# Patient Record
Sex: Female | Born: 1950 | Race: Black or African American | Hispanic: No | Marital: Married | Smoking: Never smoker
Health system: Southern US, Community
[De-identification: ages and names within clinical notes are randomized; demographics above are authoritative.]

## PROBLEM LIST (undated history)

## (undated) DIAGNOSIS — M1990 Unspecified osteoarthritis, unspecified site: Secondary | ICD-10-CM

## (undated) DIAGNOSIS — M069 Rheumatoid arthritis, unspecified: Secondary | ICD-10-CM

## (undated) DIAGNOSIS — T7840XA Allergy, unspecified, initial encounter: Secondary | ICD-10-CM

## (undated) DIAGNOSIS — D126 Benign neoplasm of colon, unspecified: Secondary | ICD-10-CM

## (undated) DIAGNOSIS — G4700 Insomnia, unspecified: Secondary | ICD-10-CM

## (undated) DIAGNOSIS — E785 Hyperlipidemia, unspecified: Secondary | ICD-10-CM

## (undated) DIAGNOSIS — K219 Gastro-esophageal reflux disease without esophagitis: Secondary | ICD-10-CM

## (undated) DIAGNOSIS — I10 Essential (primary) hypertension: Secondary | ICD-10-CM

## (undated) DIAGNOSIS — D849 Immunodeficiency, unspecified: Secondary | ICD-10-CM

## (undated) DIAGNOSIS — C801 Malignant (primary) neoplasm, unspecified: Secondary | ICD-10-CM

## (undated) DIAGNOSIS — Z5189 Encounter for other specified aftercare: Secondary | ICD-10-CM

## (undated) DIAGNOSIS — R748 Abnormal levels of other serum enzymes: Secondary | ICD-10-CM

## (undated) DIAGNOSIS — E876 Hypokalemia: Secondary | ICD-10-CM

## (undated) DIAGNOSIS — IMO0001 Reserved for inherently not codable concepts without codable children: Secondary | ICD-10-CM

## (undated) DIAGNOSIS — R911 Solitary pulmonary nodule: Secondary | ICD-10-CM

## (undated) DIAGNOSIS — G629 Polyneuropathy, unspecified: Secondary | ICD-10-CM

## (undated) HISTORY — PX: COLON RESECTION: SHX5231

## (undated) HISTORY — DX: Gastro-esophageal reflux disease without esophagitis: K21.9

## (undated) HISTORY — DX: Encounter for other specified aftercare: Z51.89

## (undated) HISTORY — PX: COLON SURGERY: SHX602

## (undated) HISTORY — DX: Malignant (primary) neoplasm, unspecified: C80.1

## (undated) HISTORY — DX: Reserved for inherently not codable concepts without codable children: IMO0001

## (undated) HISTORY — PX: APPENDECTOMY: SHX54

## (undated) HISTORY — DX: Solitary pulmonary nodule: R91.1

## (undated) HISTORY — DX: Polyneuropathy, unspecified: G62.9

## (undated) HISTORY — DX: Rheumatoid arthritis, unspecified: M06.9

## (undated) HISTORY — DX: Immunodeficiency, unspecified: D84.9

## (undated) HISTORY — PX: POLYPECTOMY: SHX149

## (undated) HISTORY — DX: Benign neoplasm of colon, unspecified: D12.6

## (undated) HISTORY — DX: Essential (primary) hypertension: I10

## (undated) HISTORY — DX: Hyperlipidemia, unspecified: E78.5

## (undated) HISTORY — DX: Abnormal levels of other serum enzymes: R74.8

## (undated) HISTORY — DX: Insomnia, unspecified: G47.00

## (undated) HISTORY — PX: COLONOSCOPY: SHX174

## (undated) HISTORY — DX: Hypokalemia: E87.6

## (undated) HISTORY — DX: Allergy, unspecified, initial encounter: T78.40XA

---

## 2003-03-15 DIAGNOSIS — C20 Malignant neoplasm of rectum: Secondary | ICD-10-CM

## 2003-03-15 HISTORY — DX: Malignant neoplasm of rectum: C20

## 2010-07-02 ENCOUNTER — Emergency Department (HOSPITAL_COMMUNITY)
Admission: EM | Admit: 2010-07-02 | Discharge: 2010-07-02 | Disposition: A | Discharge status: Home / Self Care | Payer: BC Managed Care – PPO | Attending: Emergency Medicine | Admitting: Emergency Medicine

## 2010-07-02 DIAGNOSIS — I10 Essential (primary) hypertension: Secondary | ICD-10-CM | POA: Insufficient documentation

## 2010-07-02 DIAGNOSIS — Z85038 Personal history of other malignant neoplasm of large intestine: Secondary | ICD-10-CM | POA: Insufficient documentation

## 2010-07-02 DIAGNOSIS — K5900 Constipation, unspecified: Secondary | ICD-10-CM | POA: Insufficient documentation

## 2010-07-02 DIAGNOSIS — K625 Hemorrhage of anus and rectum: Secondary | ICD-10-CM | POA: Insufficient documentation

## 2010-07-02 DIAGNOSIS — K649 Unspecified hemorrhoids: Secondary | ICD-10-CM | POA: Insufficient documentation

## 2010-07-02 LAB — COMPREHENSIVE METABOLIC PANEL
AST: 31 U/L (ref 0–37)
Albumin: 3.9 g/dL (ref 3.5–5.2)
Calcium: 9.8 mg/dL (ref 8.4–10.5)
Creatinine, Ser: 0.63 mg/dL (ref 0.4–1.2)
GFR calc Af Amer: >60 mL/min (ref >60)

## 2010-07-02 LAB — DIFFERENTIAL
Basophils Relative: 0 % (ref 0–1)
Eosinophils Absolute: 0 10*3/uL (ref 0.0–0.7)
Eosinophils Relative: 1 % (ref 0–5)
Monocytes Absolute: 0.3 10*3/uL (ref 0.1–1.0)
Monocytes Relative: 7 % (ref 3–12)
Neutrophils Relative %: 67 % (ref 43–77)

## 2010-07-02 LAB — CBC
MCH: 26.5 pg (ref 26.0–34.0)
MCHC: 33.6 g/dL (ref 30.0–36.0)
Platelets: 158 10*3/uL (ref 150–400)
RDW: 14.9 % (ref 11.5–15.5)

## 2010-07-16 ENCOUNTER — Ambulatory Visit (AMBULATORY_SURGERY_CENTER): Payer: BC Managed Care – PPO | Admitting: *Deleted

## 2010-07-16 VITALS — Ht 64.0 in | Wt 138.0 lb

## 2010-07-16 DIAGNOSIS — K921 Melena: Secondary | ICD-10-CM

## 2010-07-16 DIAGNOSIS — Z85038 Personal history of other malignant neoplasm of large intestine: Secondary | ICD-10-CM

## 2010-07-16 MED ORDER — PEG-KCL-NACL-NASULF-NA ASC-C 100 G PO SOLR: ORAL | Status: DC

## 2010-07-19 ENCOUNTER — Encounter: Payer: Self-pay | Admitting: Gastroenterology

## 2010-07-19 ENCOUNTER — Telehealth: Payer: Self-pay | Admitting: Gastroenterology

## 2010-07-19 NOTE — Telephone Encounter (Signed)
Forwarded to Dr. Patterson for review.  °

## 2010-07-30 ENCOUNTER — Ambulatory Visit (AMBULATORY_SURGERY_CENTER): Payer: BC Managed Care – PPO | Admitting: Gastroenterology

## 2010-07-30 ENCOUNTER — Encounter: Payer: Self-pay | Admitting: Gastroenterology

## 2010-07-30 VITALS — BP 155/106 | HR 89 | Temp 98.2°F | Resp 18 | Ht 64.0 in | Wt 134.0 lb

## 2010-07-30 DIAGNOSIS — Z85038 Personal history of other malignant neoplasm of large intestine: Secondary | ICD-10-CM

## 2010-07-30 MED ORDER — SODIUM CHLORIDE 0.9 % IV SOLN: 500.0000 mL | INTRAVENOUS | Status: AC

## 2010-07-30 MED ORDER — POLYETHYLENE GLYCOL 3350 17 GM/SCOOP PO POWD: 17.0000 g | Freq: Every day | ORAL | Status: AC

## 2010-07-30 MED ORDER — LIDOCAINE (ANORECTAL) 5 % EX CREA: 1.0000 "application " | TOPICAL_CREAM | Freq: Two times a day (BID) | CUTANEOUS | Status: DC

## 2010-07-30 NOTE — Patient Instructions (Signed)
Please refer to blue and green discharge instruction sheets. 

## 2010-08-02 ENCOUNTER — Telehealth: Payer: Self-pay

## 2010-08-02 NOTE — Telephone Encounter (Signed)
Line busy

## 2014-05-08 ENCOUNTER — Encounter: Payer: Self-pay | Admitting: Gastroenterology

## 2015-03-23 ENCOUNTER — Encounter: Payer: Self-pay | Admitting: Internal Medicine

## 2015-05-04 ENCOUNTER — Ambulatory Visit (AMBULATORY_SURGERY_CENTER): Payer: Self-pay

## 2015-05-04 VITALS — Ht 64.0 in | Wt 147.0 lb

## 2015-05-04 DIAGNOSIS — Z800 Family history of malignant neoplasm of digestive organs: Secondary | ICD-10-CM

## 2015-05-04 MED ORDER — NA SULFATE-K SULFATE-MG SULF 17.5-3.13-1.6 GM/177ML PO SOLN: 1.0000 | Freq: Once | ORAL | Status: DC

## 2015-05-04 NOTE — Progress Notes (Signed)
No egg or soy allergies Not on home 02 No previous anesthesia complications No diet or weight loss meds 

## 2015-05-20 ENCOUNTER — Encounter: Payer: Self-pay | Admitting: Internal Medicine

## 2015-05-25 ENCOUNTER — Encounter: Payer: Self-pay | Admitting: Internal Medicine

## 2015-05-25 ENCOUNTER — Ambulatory Visit (AMBULATORY_SURGERY_CENTER): Payer: BLUE CROSS/BLUE SHIELD | Admitting: Internal Medicine

## 2015-05-25 VITALS — BP 138/81 | HR 67 | Temp 99.8°F | Resp 15 | Ht 64.0 in | Wt 147.0 lb

## 2015-05-25 DIAGNOSIS — D125 Benign neoplasm of sigmoid colon: Secondary | ICD-10-CM | POA: Diagnosis not present

## 2015-05-25 DIAGNOSIS — Z1211 Encounter for screening for malignant neoplasm of colon: Secondary | ICD-10-CM | POA: Diagnosis not present

## 2015-05-25 DIAGNOSIS — K635 Polyp of colon: Secondary | ICD-10-CM

## 2015-05-25 DIAGNOSIS — D122 Benign neoplasm of ascending colon: Secondary | ICD-10-CM | POA: Diagnosis not present

## 2015-05-25 DIAGNOSIS — Z85038 Personal history of other malignant neoplasm of large intestine: Secondary | ICD-10-CM | POA: Diagnosis not present

## 2015-05-25 MED ORDER — SODIUM CHLORIDE 0.9 % IV SOLN: 500.0000 mL | INTRAVENOUS | Status: DC

## 2015-05-25 NOTE — Progress Notes (Signed)
A/ox3, pleased with MAC, report to RN 

## 2015-05-25 NOTE — Op Note (Signed)
[place] Patient Name: Alexis Cabrera Procedure Date: 05/25/2015 1:42 PM MRN: [id number] Endoscopist: Jerene Bears , MD Age: 65 Referring MD:  Date of Birth: 1951/01/21 Gender: Female Procedure:                Colonoscopy Indications:              High risk colon cancer surveillance: Personal                            history of colon cancer, Last colonoscopy: 2012 Medicines:                Monitored Anesthesia Care Procedure:                Pre-Anesthesia Assessment:                           - Prior to the procedure, a History and Physical                            was performed, and patient medications and                            allergies were reviewed. The patient's tolerance of                            previous anesthesia was also reviewed. The risks                            and benefits of the procedure and the sedation                            options and risks were discussed with the patient.                            All questions were answered, and informed consent                            was obtained. Prior Anticoagulants: The patient has                            taken no previous anticoagulant or antiplatelet                            agents. ASA Grade Assessment: III - A patient with                            severe systemic disease. After reviewing the risks                            and benefits, the patient was deemed in                            satisfactory condition to undergo the procedure.  After obtaining informed consent, the colonoscope                            was passed under direct vision. Throughout the                            procedure, the patient's blood pressure, pulse, and                            oxygen saturations were monitored continuously. The                            Model PCF-H190L [id number]) scope was introduced                            through the anus and advanced to the  the cecum,                            identified by appendiceal orifice and ileocecal                            valve. The colonoscopy was performed without                            difficulty. The patient tolerated the procedure                            well. The quality of the bowel preparation was                            good. The quality of the bowel preparation was                            good. The ileocecal valve, appendiceal orifice, and                            rectum were photographed. Scope In: 1:58:52 PM Scope Out: 2:14:08 PM Scope Withdrawal Time: 0 hours 12 minutes 1 second  Total Procedure Duration: 0 hours 15 minutes 16 seconds  Findings:      The perianal and digital rectal examinations were normal.      A 4 mm polyp was found in the ascending colon. The polyp was sessile.       The polyp was removed with a cold snare. Resection and retrieval were       complete.      Two sessile polyps were found in the sigmoid colon. The polyps were 2 to       3 mm in size. These polyps were removed with a cold snare. Resection and       retrieval were complete.      There was evidence of a prior end-to-end colo-colonic anastomosis in the       proximal rectum. This was patent and was characterized by an intact       staple line. The anastomosis was traversed and without significant       narrowing.  Retroflexion in the rectum was not performed due to post-surgical       anatomy but close inspection on forward view revealed hypertrophied anal       papillae and small internal hemorrhoids. Complications:            No immediate complications. Estimated Blood Loss:     Estimated blood loss was minimal. Impression:               - One 4 mm polyp in the ascending colon, removed                            with a cold snare. Resected and retrieved.                           - Two 2 to 3 mm polyps in the sigmoid colon,                            removed with a cold snare.  Resected and retrieved.                           - Patent end-to-end colo-colonic anastomosis,                            characterized by an intact staple line. Recommendation:           - Resume previous diet.                           - Continue present medications.                           - Await pathology results.                           - Repeat colonoscopy for surveillance based on                            pathology results. Procedure Code(s):        --- Professional ---                           [id number], Colonoscopy, flexible; with removal of                            tumor(s), polyp(s), or other lesion(s) by snare                            technique CPT copyright 2016 American Medical Association. All rights reserved. Lajuan Lines. Hilarie Fredrickson, MD Jerene Bears, MD 05/25/2015 2:22:21 PM This report has been signed electronically. Number of Addenda: 0

## 2015-05-25 NOTE — Progress Notes (Signed)
Called to room to assist during endoscopic procedure.  Patient ID and intended procedure confirmed with present staff. Received instructions for my participation in the procedure from the performing physician.  

## 2015-05-25 NOTE — Patient Instructions (Addendum)
YOU HAD AN ENDOSCOPIC PROCEDURE TODAY AT [facility]:   Refer to the procedure report that was given to you for any specific questions about what was found during the examination.  If the procedure report does not answer your questions, please call your gastroenterologist to clarify.  If you requested that your care partner not be given the details of your procedure findings, then the procedure report has been included in a sealed envelope for you to review at your convenience later.  YOU SHOULD EXPECT: Some feelings of bloating in the abdomen. Passage of more gas than usual.  Walking can help get rid of the air that was put into your GI tract during the procedure and reduce the bloating. If you had a lower endoscopy (such as a colonoscopy or flexible sigmoidoscopy) you may notice spotting of blood in your stool or on the toilet paper. If you underwent a bowel prep for your procedure, you may not have a normal bowel movement for a few days.  Please Note:  You might notice some irritation and congestion in your nose or some drainage.  This is from the oxygen used during your procedure.  There is no need for concern and it should clear up in a day or so.  SYMPTOMS TO REPORT IMMEDIATELY:   Following lower endoscopy (colonoscopy or flexible sigmoidoscopy):  Excessive amounts of blood in the stool  Significant tenderness or worsening of abdominal pains  Swelling of the abdomen that is new, acute  Fever of 100F or higher   For urgent or emergent issues, a gastroenterologist can be reached at any hour by calling [phone].   DIET: Your first meal following the procedure should be a small meal and then it is ok to progress to your normal diet. Heavy or fried foods are harder to digest and may make you feel nauseous or bloated.  Likewise, meals heavy in dairy and vegetables can increase bloating.  Drink plenty of fluids but you should avoid alcoholic beverages for 24  hours.  ACTIVITY:  You should plan to take it easy for the rest of today and you should NOT DRIVE or use heavy machinery until tomorrow (because of the sedation medicines used during the test).    FOLLOW UP: Our staff will call the number listed on your records the next business day following your procedure to check on you and address any questions or concerns that you may have regarding the information given to you following your procedure. If we do not reach you, we will leave a message.  However, if you are feeling well and you are not experiencing any problems, there is no need to return our call.  We will assume that you have returned to your regular daily activities without incident.  If any biopsies were taken you will be contacted by phone or by letter within the next 1-3 weeks.  Please call us at [phone] if you have not heard about the biopsies in 3 weeks.    SIGNATURES/CONFIDENTIALITY: You and/or your care partner have signed paperwork which will be entered into your electronic medical record.  These signatures attest to the fact that that the information above on your After Visit Summary has been reviewed and is understood.  Full responsibility of the confidentiality of this discharge information lies with you and/or your care-partner.  Polyps, hemorrhoids-handouts given  Repeat colonoscopy will be determined by pathology.   Miralax daily, if this does not work then call the office.

## 2015-05-26 ENCOUNTER — Telehealth: Payer: Self-pay | Admitting: *Deleted

## 2015-05-26 NOTE — Telephone Encounter (Signed)
  Follow up Call-  Call back number 05/25/2015  Post procedure Call Back phone  # [phone]  Permission to leave phone message Yes     Patient questions:  Do you have a fever, pain , or abdominal swelling? No. Pain Score  0 *  Have you tolerated food without any problems? Yes.    Have you been able to return to your normal activities? Yes.    Do you have any questions about your discharge instructions: Diet   No. Medications  No. Follow up visit  No.  Do you have questions or concerns about your Care? No.  Actions: * If pain score is 4 or above: No action needed, pain <4.

## 2015-05-28 ENCOUNTER — Encounter: Payer: Self-pay | Admitting: Internal Medicine

## 2016-05-23 ENCOUNTER — Encounter (HOSPITAL_COMMUNITY): Payer: Self-pay | Admitting: *Deleted

## 2016-05-23 ENCOUNTER — Emergency Department (HOSPITAL_COMMUNITY)
Admission: EM | Admit: 2016-05-23 | Discharge: 2016-05-24 | Disposition: A | Discharge status: Home / Self Care | Payer: BLUE CROSS/BLUE SHIELD | Attending: Emergency Medicine | Admitting: Emergency Medicine

## 2016-05-23 ENCOUNTER — Emergency Department (HOSPITAL_COMMUNITY): Payer: BLUE CROSS/BLUE SHIELD

## 2016-05-23 DIAGNOSIS — I10 Essential (primary) hypertension: Secondary | ICD-10-CM | POA: Insufficient documentation

## 2016-05-23 DIAGNOSIS — N3091 Cystitis, unspecified with hematuria: Secondary | ICD-10-CM | POA: Insufficient documentation

## 2016-05-23 DIAGNOSIS — Z79899 Other long term (current) drug therapy: Secondary | ICD-10-CM | POA: Insufficient documentation

## 2016-05-23 DIAGNOSIS — R319 Hematuria, unspecified: Secondary | ICD-10-CM | POA: Diagnosis present

## 2016-05-23 DIAGNOSIS — Z85038 Personal history of other malignant neoplasm of large intestine: Secondary | ICD-10-CM | POA: Insufficient documentation

## 2016-05-23 LAB — CBC WITH DIFFERENTIAL/PLATELET
BASOS PCT: 1 %
Basophils Absolute: 0 10*3/uL (ref 0.0–0.1)
EOS ABS: 0 10*3/uL (ref 0.0–0.7)
Eosinophils Relative: 0 %
HCT: 36.4 % (ref 36.0–46.0)
Hemoglobin: 12 g/dL (ref 12.0–15.0)
Lymphocytes Relative: 52 %
Lymphs Abs: 2 10*3/uL (ref 0.7–4.0)
MCH: 25.8 pg — AB (ref 26.0–34.0)
MCHC: 33 g/dL (ref 30.0–36.0)
MCV: 78.3 fL (ref 78.0–100.0)
MONO ABS: 0.3 10*3/uL (ref 0.1–1.0)
Monocytes Relative: 8 %
NEUTROS ABS: 1.5 10*3/uL — AB (ref 1.7–7.7)
Neutrophils Relative %: 39 %
PLATELETS: 158 10*3/uL (ref 150–400)
RBC: 4.65 MIL/uL (ref 3.87–5.11)
RDW: 14.8 % (ref 11.5–15.5)
WBC: 3.8 10*3/uL — ABNORMAL LOW (ref 4.0–10.5)

## 2016-05-23 LAB — URINALYSIS, ROUTINE W REFLEX MICROSCOPIC
BILIRUBIN URINE: NEGATIVE
Glucose, UA: NEGATIVE mg/dL
Ketones, ur: NEGATIVE mg/dL
LEUKOCYTES UA: NEGATIVE
NITRITE: NEGATIVE
PH: 6 (ref 5.0–8.0)
Protein, ur: 100 mg/dL — AB
SPECIFIC GRAVITY, URINE: 1.02 (ref 1.005–1.030)
Squamous Epithelial / LPF: NONE SEEN

## 2016-05-23 LAB — BASIC METABOLIC PANEL
Anion gap: 7 (ref 5–15)
BUN: 11 mg/dL (ref 6–20)
CHLORIDE: 110 mmol/L (ref 101–111)
CO2: 24 mmol/L (ref 22–32)
CREATININE: 0.73 mg/dL (ref 0.44–1.00)
Calcium: 8.6 mg/dL — ABNORMAL LOW (ref 8.9–10.3)
GFR calc Af Amer: >60 mL/min (ref >60)
GFR calc non Af Amer: >60 mL/min (ref >60)
Glucose, Bld: 100 mg/dL — ABNORMAL HIGH (ref 65–99)
Potassium: 3.3 mmol/L — ABNORMAL LOW (ref 3.5–5.1)
SODIUM: 141 mmol/L (ref 135–145)

## 2016-05-23 MED ORDER — NITROFURANTOIN MONOHYD MACRO 100 MG PO CAPS: 100.0000 mg | ORAL_CAPSULE | Freq: Two times a day (BID) | ORAL | 0 refills | Status: AC

## 2016-05-23 NOTE — ED Notes (Signed)
Patient transported to CT 

## 2016-05-23 NOTE — ED Triage Notes (Signed)
Sl nausea

## 2016-05-23 NOTE — ED Notes (Signed)
Nurse drawing labs. 

## 2016-05-23 NOTE — ED Triage Notes (Signed)
The pt is c/o bloody urine for 3 days  And she feels like she is not emptying her bladder

## 2016-05-23 NOTE — ED Notes (Signed)
Pt providing urine sample.

## 2016-05-23 NOTE — ED Provider Notes (Signed)
[facility] DEPT Provider Note   CSN: [id number] Arrival date & time: 05/23/16  2149     History   Chief Complaint Chief Complaint  Patient presents with  . Hematuria    HPI Alexis Cabrera is a 66 y.o. female.  HPI  66 year old female with distant history of colon cancer proximally 10 years ago presenting for evaluation of flank pain and hematuria. Patient reports 2 days of intermittent sharp pain radiating from her left flank to her groin. Pain is intermittent, severe. Reports that in between sharp pains, she does have a constant dull ache. Reports that her hematuria started during the day today. States that she is sure that it is not vaginal bleeding. Denies constipation, diarrhea, or blood in her stool. No nausea or vomiting. Denies fevers. No prior history of kidney stones. No aggravating or relieving factors or associated symptoms. Denies dysuria or frequency.   Past Medical History:  Diagnosis Date  . Allergy    seasonal  . Blood transfusion   . Blood transfusion without reported diagnosis   . 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 Hyperlipidemia   . Hypertension     There are no active problems to display for this patient.   Past Surgical History:  Procedure Laterality Date  . APPENDECTOMY     cancer  . CESAREAN SECTION     twice  . COLON RESECTION    . COLON SURGERY     cancer/treated with chemo and radiation  . COLONOSCOPY    . POLYPECTOMY      OB History    No data available       Home Medications    Prior to Admission medications   Medication Sig Start Date End Date Taking? Authorizing Provider  amLODipine (NORVASC) 5 MG tablet Take 5 mg by mouth daily.   Yes Historical Provider, MD  losartan (COZAAR) 50 MG tablet Take 50 mg by mouth daily.   Yes Historical Provider, MD  potassium chloride SA (K-DUR,KLOR-CON) 20 MEQ tablet Take 1 tablet by mouth Daily. 06/23/10  Yes Historical Provider, MD  zolpidem (AMBIEN) 10 MG tablet Take 10 mg by mouth at  bedtime as needed for sleep.   Yes Historical Provider, MD  nitrofurantoin, macrocrystal-monohydrate, (MACROBID) 100 MG capsule Take 1 capsule (100 mg total) by mouth 2 (two) times daily. 05/23/16 05/30/16  Jayme Mednick Algernon Huxley, MD    Family History Family History  Problem Relation Age of Onset  . Colon cancer Father 30  . Colon cancer Sister 95    Social History Social History  Substance Use Topics  . Smoking status: Never Smoker  . Smokeless tobacco: Never Used  . Alcohol use No     Allergies   Hydrocodone   Review of Systems Review of Systems  Constitutional: Negative for appetite change, chills and fever.  HENT: Negative for congestion, rhinorrhea and sore throat.   Eyes: Negative for visual disturbance.  Respiratory: Negative for cough, shortness of breath and wheezing.   Cardiovascular: Negative for chest pain and palpitations.  Gastrointestinal: Positive for abdominal pain. Negative for abdominal distention, blood in stool, constipation, diarrhea, nausea and vomiting.  Genitourinary: Positive for flank pain and hematuria. Negative for decreased urine volume, dysuria, frequency, pelvic pain, vaginal bleeding and vaginal discharge.  Musculoskeletal: Negative for arthralgias, back pain, gait problem, joint swelling, myalgias, neck pain and neck stiffness.  Skin: Negative for rash.  Neurological: Negative for dizziness, tremors, syncope, facial asymmetry, speech difficulty, weakness, numbness and headaches.  Psychiatric/Behavioral:  Negative for agitation, behavioral problems and confusion.     Physical Exam Updated Vital Signs BP 158/92   Pulse 66   Temp 98.2 F (36.8 C) (Oral)   Resp 22   Ht 5\' 4"  (1.626 m)   Wt 70.3 kg   SpO2 98%   BMI 26.61 kg/m   Physical Exam  Constitutional: She is oriented to person, place, and time. She appears well-developed and well-nourished. No distress.  HENT:  Head: Normocephalic and atraumatic.  Right Ear: External ear normal.    Left Ear: External ear normal.  Nose: Nose normal.  Mouth/Throat: Oropharynx is clear and moist. No oropharyngeal exudate.  Eyes: Conjunctivae and EOM are normal. Pupils are equal, round, and reactive to light. Right eye exhibits no discharge. Left eye exhibits no discharge.  Neck: Normal range of motion. Neck supple.  Cardiovascular: Normal rate, regular rhythm and normal heart sounds.  Exam reveals no gallop and no friction rub.   No murmur heard. Pulmonary/Chest: Breath sounds normal. No respiratory distress. She has no wheezes. She has no rales.  Abdominal: Soft. Bowel sounds are normal. She exhibits no distension. There is tenderness (L flank). There is no guarding.  Musculoskeletal: Normal range of motion. She exhibits no edema or tenderness.  Neurological: She is alert and oriented to person, place, and time. She exhibits normal muscle tone.  Skin: Skin is warm and dry. No rash noted. She is not diaphoretic.  Psychiatric: She has a normal mood and affect. Her behavior is normal. Judgment and thought content normal.     ED Treatments / Results  Labs (all labs ordered are listed, but only abnormal results are displayed) Labs Reviewed  URINALYSIS, ROUTINE W REFLEX MICROSCOPIC - Abnormal; Notable for the following:       Result Value   Color, Urine RED (*)    APPearance CLOUDY (*)    Hgb urine dipstick LARGE (*)    Protein, ur 100 (*)    Bacteria, UA FEW (*)    All other components within normal limits  CBC WITH DIFFERENTIAL/PLATELET - Abnormal; Notable for the following:    WBC 3.8 (*)    MCH 25.8 (*)    Neutro Abs 1.5 (*)    All other components within normal limits  BASIC METABOLIC PANEL - Abnormal; Notable for the following:    Potassium 3.3 (*)    Glucose, Bld 100 (*)    Calcium 8.6 (*)    All other components within normal limits    EKG  EKG Interpretation None       Radiology Ct Abdomen Pelvis Wo Contrast  Result Date: 05/23/2016 CLINICAL DATA:  Abdominal  pain with nausea for 3 days. EXAM: CT ABDOMEN AND PELVIS WITHOUT CONTRAST TECHNIQUE: Multidetector CT imaging of the abdomen and pelvis was performed following the standard protocol without IV contrast. COMPARISON:  None. FINDINGS: Lower chest: 3 mm nodule in the right lung base. 4 mm nodule in the left lung base. Small esophageal hiatal hernia. Hepatobiliary: No focal liver abnormality is seen. No gallstones, gallbladder wall thickening, or biliary dilatation. Pancreas: Unremarkable. No pancreatic ductal dilatation or surrounding inflammatory changes. Spleen: Normal in size without focal abnormality. Adrenals/Urinary Tract: No adrenal gland nodules. Kidneys are symmetrical. No hydronephrosis or hydroureter. No urinary tract stones. Bladder wall is not thickened. Stomach/Bowel: Postoperative changes with apparent rectal resection and sigmoid colorectal anastomosis. Surgical clips in the area. Stranding in the presacral fat without mass or nodularity. This probably represents postoperative scarring but comparison with any  old postoperative studies would be useful to evaluate for any change. Diffusely stool-filled colon without dilatation or wall thickening. Stomach and small bowel are decompressed. No evidence of obstruction but unable to evaluate further without oral contrast material. Vascular/Lymphatic: Aortic atherosclerosis. No enlarged abdominal or pelvic lymph nodes. Reproductive: Uterus and bilateral adnexa are unremarkable. Other: No free air or free fluid in the abdomen. Mild infiltration of the mesenteric knee with mesenteric lymph nodes. No pathologic lymphadenopathy. Changes likely represent inflammatory process, possibly mesenteritis. Musculoskeletal: Degenerative changes in the spine. No destructive bone lesions. IMPRESSION: No renal or ureteral stone or obstruction. Postoperative changes in the low pelvis suggesting rectal resection and anastomosis. Infiltration in the presacral space is probably  postoperative but correlation with any old outside films would be useful. Mild mesenteric infiltration may indicate mesenteritis. Three and 4 mm nodules in the lung bases. Non-contrast chest CT can be considered in 12 months in a patient with history of colon cancer. This recommendation follows the consensus statement: Guidelines for Management of Incidental Pulmonary Nodules Detected on CT Images: From the Fleischner Society 2017; Radiology 2017; 284:228-243. Electronically Signed   By: Lucienne Capers M.D.   On: 05/23/2016 23:11    Procedures Procedures (including critical care time)  Medications Ordered in ED Medications - No data to display   Initial Impression / Assessment and Plan / ED Course  I have reviewed the triage vital signs and the nursing notes.  Pertinent labs & imaging results that were available during my care of the patient were reviewed by me and considered in my medical decision making (see chart for details).     Generally well-appearing. Afebrile and hemodynamically stable. Abdominal exam benign, with the exception of mild tenderness to palpation over the left flank.  UA with large blood and few bacteria. Labs with no leukocytosis and normal hemoglobin. Patient has mild hypokalemia with potassium of 3.3, but is on daily potassium supplementation.   Differential diagnosis includes hemorrhagic cystitis vs kidney stone vs bladder malignancy.  CT abdomen and pelvis obtained that shows no renal or ureteral stone or obstruction. Post operative changes from the patient's partial colectomy seen. There are changes that may suggest mesenteritis, but no enlarged abdominal or pelvic lymph nodes. Patient also seen to have some nodules in the lung bases.  Results discussed with the patient and her husband at bedside. Discussed the need for follow-up CT chest without contrast in one year to evaluate for change in size of her lung nodules. This was explicitly written in the patient's  discharge instructions for her to share with her primary care provider. Patient does have small bacteria in her urine, so will treat with one-week course of Macrobid to see if this improves her hematuria. If hematuria does not improve with antibiotics, patient will require outpatient follow-up with urology for cystoscopy to rule out other pathologies such as malignancy. This was explained to the patient and her husband, and they were given the phone number for urology.  Return precautions given for fevers, worsening pain, increased bleeding, vomiting, or new or concerning symptoms. Patient discharged in good condition.  Care of patient overseen by my attending, Dr. Stark Jock.  Final Clinical Impressions(s) / ED Diagnoses   Final diagnoses:  Hemorrhagic cystitis    New Prescriptions New Prescriptions   NITROFURANTOIN, MACROCRYSTAL-MONOHYDRATE, (MACROBID) 100 MG CAPSULE    Take 1 capsule (100 mg total) by mouth 2 (two) times daily.     Donel Osowski Algernon Huxley, MD 05/24/16 Nolberto Hanlon    Nathaneil Canary  [place], MD 05/24/16 1546

## 2016-05-23 NOTE — Discharge Instructions (Signed)
The CT scan of your abdomen today showed some nodules within the lower regions of your lungs. Please have your doctor order a CT scan without contrast of your lungs in 1 year for follow-up to ensure that these have not changes in size.   You can try over the counter mucinex for symptomatic relief of your congestion.

## 2016-05-23 NOTE — ED Notes (Signed)
ED Provider at bedside. 

## 2016-05-24 NOTE — ED Notes (Signed)
Pt departed in NAD, refused use of a wheelchair.  

## 2017-05-30 ENCOUNTER — Encounter (HOSPITAL_COMMUNITY): Payer: Self-pay | Admitting: *Deleted

## 2017-05-30 ENCOUNTER — Emergency Department (HOSPITAL_COMMUNITY): Payer: BLUE CROSS/BLUE SHIELD

## 2017-05-30 ENCOUNTER — Emergency Department (HOSPITAL_COMMUNITY)
Admission: EM | Admit: 2017-05-30 | Discharge: 2017-05-30 | Disposition: A | Discharge status: Home / Self Care | Payer: BLUE CROSS/BLUE SHIELD | Attending: Emergency Medicine | Admitting: Emergency Medicine

## 2017-05-30 ENCOUNTER — Other Ambulatory Visit: Payer: Self-pay

## 2017-05-30 DIAGNOSIS — R0602 Shortness of breath: Secondary | ICD-10-CM | POA: Insufficient documentation

## 2017-05-30 DIAGNOSIS — R1084 Generalized abdominal pain: Secondary | ICD-10-CM | POA: Diagnosis present

## 2017-05-30 DIAGNOSIS — Z79899 Other long term (current) drug therapy: Secondary | ICD-10-CM | POA: Insufficient documentation

## 2017-05-30 DIAGNOSIS — R310 Gross hematuria: Secondary | ICD-10-CM | POA: Diagnosis not present

## 2017-05-30 DIAGNOSIS — E785 Hyperlipidemia, unspecified: Secondary | ICD-10-CM | POA: Insufficient documentation

## 2017-05-30 DIAGNOSIS — I10 Essential (primary) hypertension: Secondary | ICD-10-CM | POA: Diagnosis not present

## 2017-05-30 HISTORY — DX: Unspecified osteoarthritis, unspecified site: M19.90

## 2017-05-30 LAB — BASIC METABOLIC PANEL
Anion gap: 8 (ref 5–15)
BUN: 8 mg/dL (ref 6–20)
CHLORIDE: 107 mmol/L (ref 101–111)
CO2: 25 mmol/L (ref 22–32)
CREATININE: 0.68 mg/dL (ref 0.44–1.00)
Calcium: 9.2 mg/dL (ref 8.9–10.3)
GFR calc Af Amer: >60 mL/min (ref >60)
Glucose, Bld: 97 mg/dL (ref 65–99)
Potassium: 3.6 mmol/L (ref 3.5–5.1)
SODIUM: 140 mmol/L (ref 135–145)

## 2017-05-30 LAB — URINALYSIS, ROUTINE W REFLEX MICROSCOPIC
BILIRUBIN URINE: NEGATIVE
Bacteria, UA: NONE SEEN
GLUCOSE, UA: NEGATIVE mg/dL
Ketones, ur: NEGATIVE mg/dL
Leukocytes, UA: NEGATIVE
Nitrite: NEGATIVE
PH: 7 (ref 5.0–8.0)
Protein, ur: 30 mg/dL — AB
SPECIFIC GRAVITY, URINE: 1.012 (ref 1.005–1.030)
Squamous Epithelial / LPF: NONE SEEN

## 2017-05-30 LAB — CBC
HCT: 37.3 % (ref 36.0–46.0)
Hemoglobin: 12.2 g/dL (ref 12.0–15.0)
MCH: 26.1 pg (ref 26.0–34.0)
MCHC: 32.7 g/dL (ref 30.0–36.0)
MCV: 79.9 fL (ref 78.0–100.0)
PLATELETS: 223 10*3/uL (ref 150–400)
RBC: 4.67 MIL/uL (ref 3.87–5.11)
RDW: 15.6 % — AB (ref 11.5–15.5)
WBC: 4.9 10*3/uL (ref 4.0–10.5)

## 2017-05-30 LAB — I-STAT TROPONIN, ED: TROPONIN I, POC: 0 ng/mL (ref 0.00–0.08)

## 2017-05-30 MED ORDER — ALBUTEROL SULFATE (2.5 MG/3ML) 0.083% IN NEBU
5.0000 mg | INHALATION_SOLUTION | Freq: Once | RESPIRATORY_TRACT | Status: AC
  Administered 2017-05-30: 5 mg via RESPIRATORY_TRACT
  Filled 2017-05-30: qty 6

## 2017-05-30 MED ORDER — SODIUM CHLORIDE 0.9 % IV BOLUS (SEPSIS)
1000.0000 mL | Freq: Once | INTRAVENOUS | Status: AC
  Administered 2017-05-30: 1000 mL via INTRAVENOUS

## 2017-05-30 MED ORDER — ALBUTEROL SULFATE HFA 108 (90 BASE) MCG/ACT IN AERS
2.0000 | INHALATION_SPRAY | Freq: Once | RESPIRATORY_TRACT | Status: AC
  Administered 2017-05-30: 2 via RESPIRATORY_TRACT
  Filled 2017-05-30: qty 6.7

## 2017-05-30 NOTE — ED Provider Notes (Signed)
[facility] EMERGENCY DEPARTMENT Provider Note   CSN: [id number] Arrival date & time: 05/30/17  5093     History   Chief Complaint Chief Complaint  Patient presents with  . Flank Pain  . Hematuria  . Chest Pain    HPI Alexis Cabrera is a 67 y.o. female.  HPI Patient presents with concern of right flank pain, and ongoing chest pain. Right flank pain is new, starting 3 days ago, without clear precipitant. Since onset pain is been persistent, sharp, crampy, severe, in the right upper quadrant, right flank. Minimal relief with Excedrin. There is associated polyuria and hematuria, with some difficulty initiating urination. No inability to urinate, no fever, chills.  Patient also complains of ongoing chest discomfort. She describes this as tightness across the entire anterior thorax, with cough, dyspnea, but again no fever, no chills per Patient is a non-smoker. She has a notable history of rheumatoid arthritis, on Humira. In addition, the patient has a history of colon cancer with with metastases in the distant past. Patient is here with her husband who assists with the HPI.  Past Medical History:  Diagnosis Date  . Allergy    seasonal  . Arthritis   . Blood transfusion   . Blood transfusion without reported diagnosis   . Cancer [facility])    colon/in appendix  . Hyperlipidemia   . Hypertension     There are no active problems to display for this patient.   Past Surgical History:  Procedure Laterality Date  . APPENDECTOMY     cancer  . CESAREAN SECTION     twice  . COLON RESECTION    . COLON SURGERY     cancer/treated with chemo and radiation  . COLONOSCOPY    . POLYPECTOMY      OB History    No data available       Home Medications    Prior to Admission medications   Medication Sig Start Date End Date Taking? Authorizing Provider  amLODipine (NORVASC) 5 MG tablet Take 5 mg by mouth daily.    [provider]  losartan (COZAAR)  50 MG tablet Take 50 mg by mouth daily.    [provider]  potassium chloride SA (K-DUR,KLOR-CON) 20 MEQ tablet Take 1 tablet by mouth Daily. 06/23/10   [provider]  zolpidem (AMBIEN) 10 MG tablet Take 10 mg by mouth at bedtime as needed for sleep.    [provider]    Family History Family History  Problem Relation Age of Onset  . Colon cancer Father 92  . Colon cancer Sister 3    Social History Social History   Tobacco Use  . Smoking status: Never Smoker  . Smokeless tobacco: Never Used  Substance Use Topics  . Alcohol use: No    Alcohol/week: 0.0 oz  . Drug use: No     Allergies   Hydrocodone   Review of Systems Review of Systems  Constitutional:       Per HPI, otherwise negative  HENT:       Per HPI, otherwise negative  Respiratory:       Per HPI, otherwise negative  Cardiovascular:       Per HPI, otherwise negative  Gastrointestinal: Negative for vomiting.  Endocrine:       Negative aside from HPI  Genitourinary:       Neg aside from HPI   Musculoskeletal:       Per HPI, otherwise negative  Skin: Negative.  Neurological: Negative for syncope.     Physical Exam Updated Vital Signs BP (!) 184/94   Pulse 65   Temp 98.1 F (36.7 C) (Oral)   Resp 11   Ht 5\' 4"  (1.626 m)   Wt 69.9 kg (154 lb)   SpO2 97%   BMI 26.43 kg/m   Physical Exam  Constitutional: She is oriented to person, place, and time. She appears well-developed and well-nourished. No distress.  HENT:  Head: Normocephalic and atraumatic.  Eyes: Conjunctivae and EOM are normal.  Cardiovascular: Normal rate and regular rhythm.  Pulmonary/Chest: Effort normal and breath sounds normal. No stridor. No respiratory distress.  Abdominal: She exhibits no distension.  Mild tenderness palpation throughout the right upper quadrant, right flank, no guarding, rebound, left abdominal exam unremarkable.  Musculoskeletal: She exhibits no edema.  Neurological: She is  alert and oriented to person, place, and time. No cranial nerve deficit.  Skin: Skin is warm and dry.  Psychiatric: She has a normal mood and affect.  Nursing note and vitals reviewed.    ED Treatments / Results  Labs (all labs ordered are listed, but only abnormal results are displayed) Labs Reviewed  CBC - Abnormal; Notable for the following components:      Result Value   RDW 15.6 (*)    All other components within normal limits  URINALYSIS, ROUTINE W REFLEX MICROSCOPIC - Abnormal; Notable for the following components:   APPearance CLOUDY (*)    Hgb urine dipstick LARGE (*)    Protein, ur 30 (*)    All other components within normal limits  BASIC METABOLIC PANEL  I-STAT TROPONIN, ED    EKG  EKG Interpretation  Date/Time:  Tuesday May 30 2017 09:31:41 EDT Ventricular Rate:  77 PR Interval:  210 QRS Duration: 84 QT Interval:  404 QTC Calculation: 457 R Axis:   54 Text Interpretation:  Sinus rhythm with 1st degree A-V block Otherwise within normal limits Confirmed by Carmin Muskrat [phone]) on 05/30/2017 12:04:25 PM       Radiology Dg Chest 2 View  Result Date: 05/30/2017 CLINICAL DATA:  Chest pain, shortness of breath, and nonproductive cough for the past week. Patient also reports right flank pain with hematuria for the past 3 days. EXAM: CHEST - 2 VIEW COMPARISON:  None in PACs FINDINGS: The lungs are adequately inflated. The interstitial markings are mildly prominent. There is no alveolar infiltrate or pleural effusion. The heart and pulmonary vascularity are normal. There is calcification in the wall of the aortic arch and tortuosity of the descending thoracic aorta. The bony thorax exhibits no acute abnormality. IMPRESSION: Mild bilateral subsegmental atelectasis consistent with acute bronchitis. No alveolar pneumonia nor pulmonary edema. Thoracic aortic atherosclerosis. Electronically Signed   By: David  Martinique M.D.   On: 05/30/2017 09:58   Ct Renal Stone  Study  Result Date: 05/30/2017 CLINICAL DATA:  Flank pain, right flank pain, hematuria EXAM: CT ABDOMEN AND PELVIS WITHOUT CONTRAST TECHNIQUE: Multidetector CT imaging of the abdomen and pelvis was performed following the standard protocol without IV contrast. COMPARISON:  CT abdomen pelvis 05/23/2016 FINDINGS: Lower chest: Mild scarring in the lung bases. No infiltrate or effusion. Decreased visibility of small right lower lobe nodule since the prior study. Hepatobiliary: Normal liver, gallbladder, and bile ducts. Pancreas: Negative Spleen: Negative Adrenals/Urinary Tract: Negative for urinary tract calculi. No renal mass or obstruction. Normal bladder. Stomach/Bowel: Negative for bowel obstruction. Small hiatal hernia. Negative for bowel mass or edema. Postop appendectomy Vascular/Lymphatic: Mild atherosclerotic disease.  Negative for aortic aneurysm. No adenopathy. Reproductive: Normal uterus.  Negative for pelvic mass. Other: No free fluid. Surgical clips around the rectum. History of cancer of the colon. Musculoskeletal: No acute skeletal abnormality. Negative for fracture or mass. IMPRESSION: Negative for urinary tract calculi. No acute abnormality identified. Electronically Signed   By: Franchot Gallo M.D.   On: 05/30/2017 13:10    Procedures Procedures (including critical care time)  Medications Ordered in ED Medications  albuterol (PROVENTIL HFA;VENTOLIN HFA) 108 (90 Base) MCG/ACT inhaler 2 puff (not administered)  sodium chloride 0.9 % bolus 1,000 mL (1,000 mLs Intravenous New Bag/Given 05/30/17 1245)  albuterol (PROVENTIL) (2.5 MG/3ML) 0.083% nebulizer solution 5 mg (5 mg Nebulization Given 05/30/17 1416)     Initial Impression / Assessment and Plan / ED Course  I have reviewed the triage vital signs and the nursing notes.  Pertinent labs & imaging results that were available during my care of the patient were reviewed by me and considered in my medical decision making (see chart for  details).   On repeat exam the patient is in similar condition, initial findings reviewed with her and her husband. No CT evidence for kidney stone, though she has multiple areas of calcification, surgical clips, there is some suspicion for either occult stone or recently passed kidney stone. No change in her abdominal pain, and she defers offer of pain medication. With possibility for bronchitis, the patient will receive albuterol  2:54 PM Patient appears better, states that she feels better. We discussed the findings again, including generally reassuring CT scan, x-ray consistent with possible bronchitis, reassuring labs, vitals. There is ongoing hematuria, but no obvious stone, and no evidence for obstruction, and no urinalysis evidence for infection. Patient will follow up with urology for consideration of other evaluation in regards to this. Patient respiratory complaints have improved here after albuterol, and she will be discharged with scheduled albuterol therapy for the coming days.  When she is not a candidate for steroids given her use of Humira. No evidence for pneumonia, no evidence for bacteremia, sepsis, no ongoing chest pain, and given the chronicity of her symptoms, low suspicion for atypical ACS, no hypoxia, tachypnea consistent with PE.   Final Clinical Impressions(s) / ED Diagnoses   Final diagnoses:  Shortness of breath  Gross hematuria     Carmin Muskrat, MD 05/30/17 1457

## 2017-05-30 NOTE — Discharge Instructions (Signed)
As discussed, your evaluation today has been largely reassuring.  But, it is important that you monitor your condition carefully, and do not hesitate to return to the ED if you develop new, or concerning changes in your condition. ? ?Otherwise, please follow-up with your physician for appropriate ongoing care. ? ?

## 2017-05-30 NOTE — ED Triage Notes (Signed)
Pt reports right side pain for several days and having blood in urine. Has urinary pressure but denies any pain with urination or fever. Per family, pt having chest pains and sob x 1 week. No acute distress is noted at triage.

## 2019-06-04 IMAGING — CT CT RENAL STONE PROTOCOL
2 of 4 series · 16 of 46 positions shown, 18 images · non-contrast
Comparison: CT abdomen pelvis 05/23/2016

CLINICAL DATA: Flank pain, right flank pain, hematuria

EXAM:
CT ABDOMEN AND PELVIS WITHOUT CONTRAST
TECHNIQUE: Multidetector CT imaging of the abdomen and pelvis was performed
following the standard protocol without IV contrast.

[Series 3: renal stone 5.0 · axial · 0.84mm/px · z∈[+814,+1239]mm · 13 of 93 slices shown, 15 images]
[im 4/93  soft-tissue]
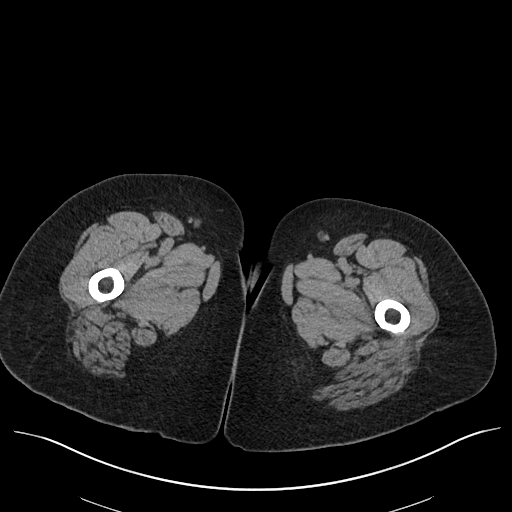
[im 4/93  bone]
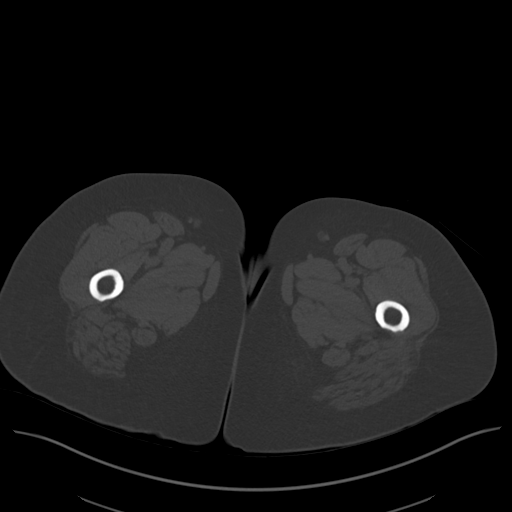
[im 12/93  soft-tissue]
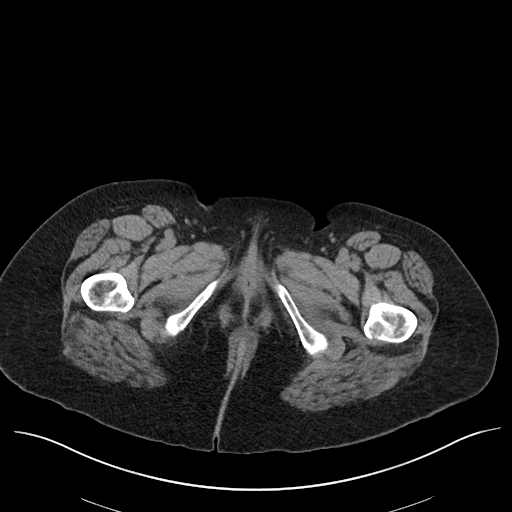
[im 19/93  soft-tissue]
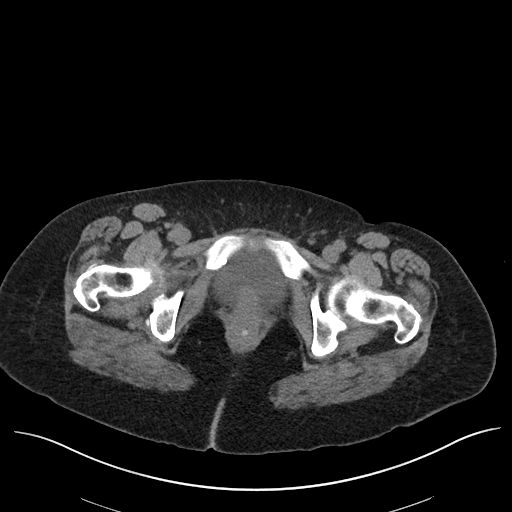
[im 26/93  soft-tissue]
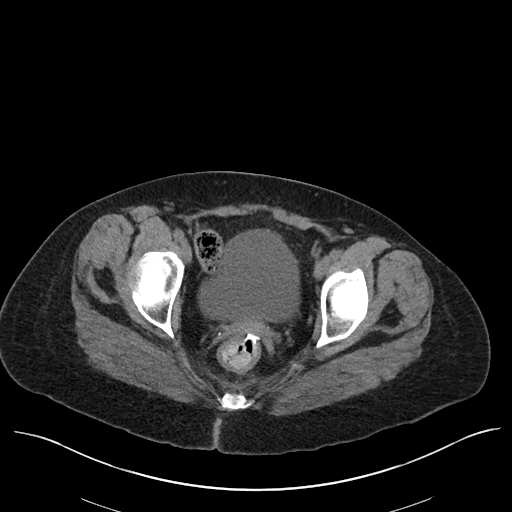
[im 34/93  soft-tissue]
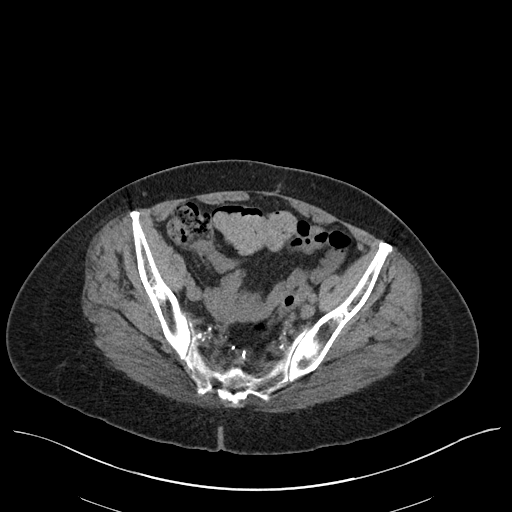
[im 41/93  soft-tissue]
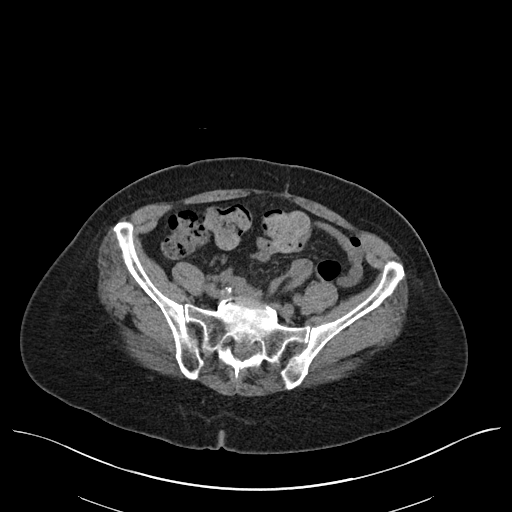
[im 48/93  soft-tissue]
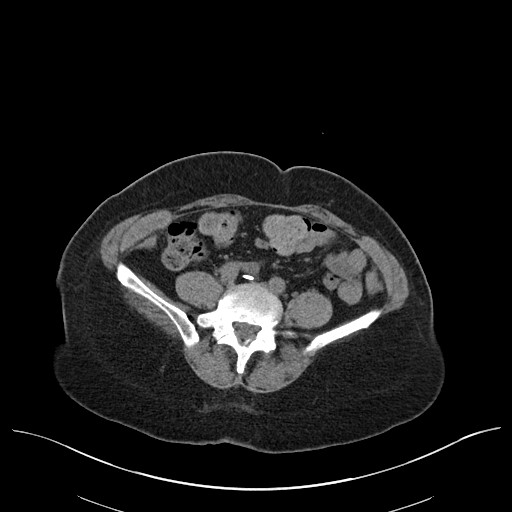
[im 52/93  soft-tissue]
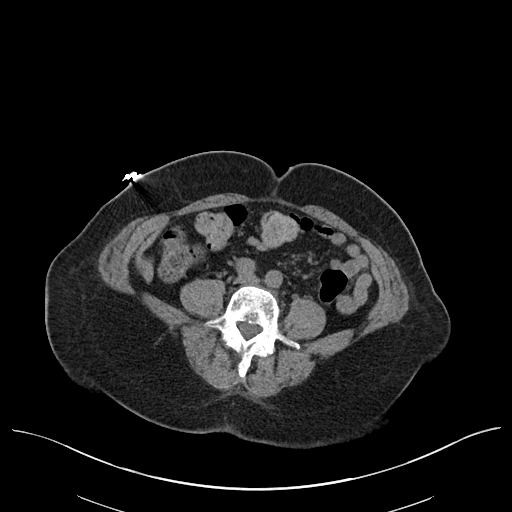
[im 59/93  soft-tissue]
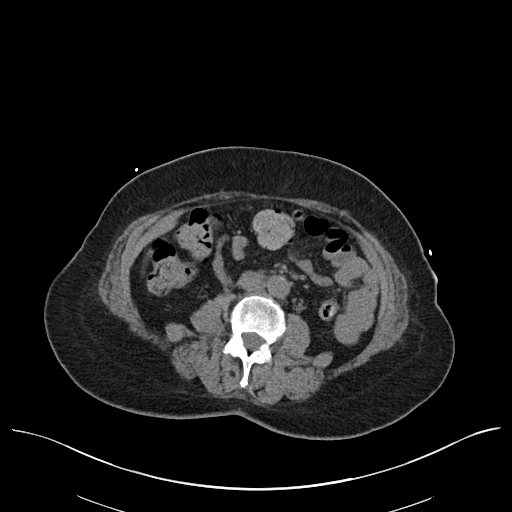
[im 59/93  bone]
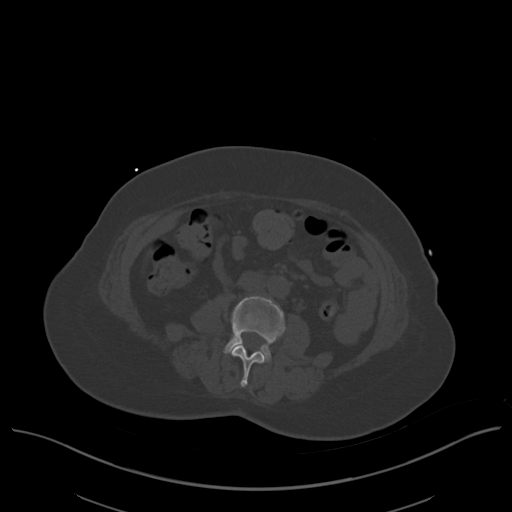
[im 67/93  soft-tissue]
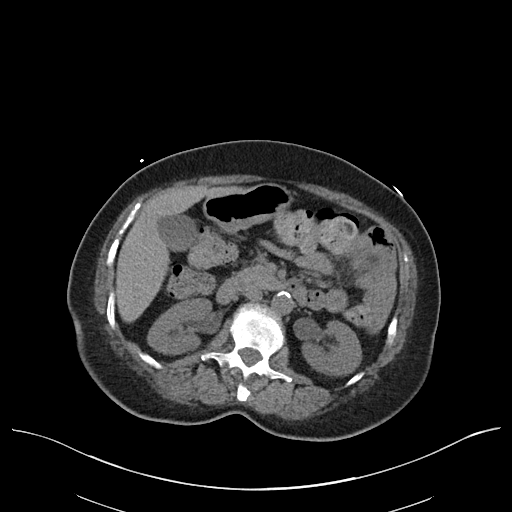
[im 74/93  soft-tissue]
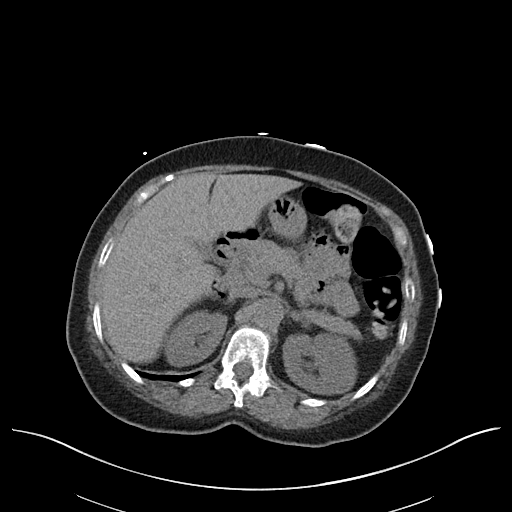
[im 81/93  soft-tissue]
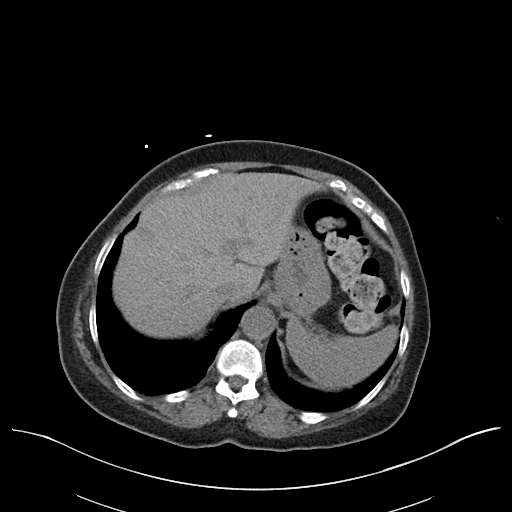
[im 89/93  soft-tissue]
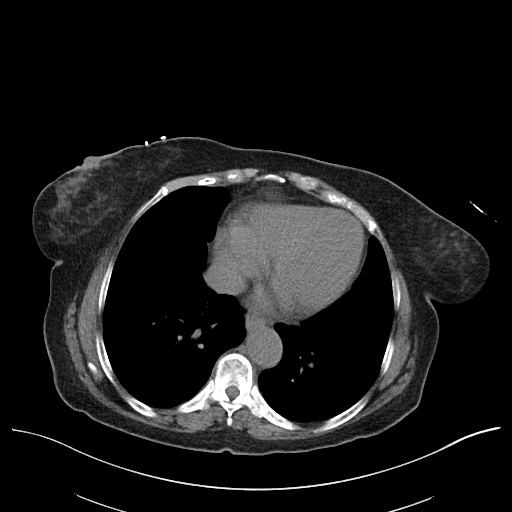

[Series 5: renal stone 3.0 cor · coronal · 0.73mm/px · 3 of 89 slices shown]
[im 30/89  soft-tissue]
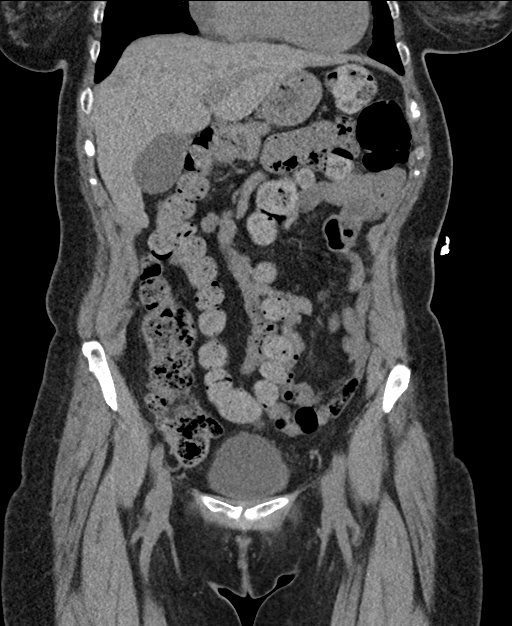
[im 40/89  soft-tissue]
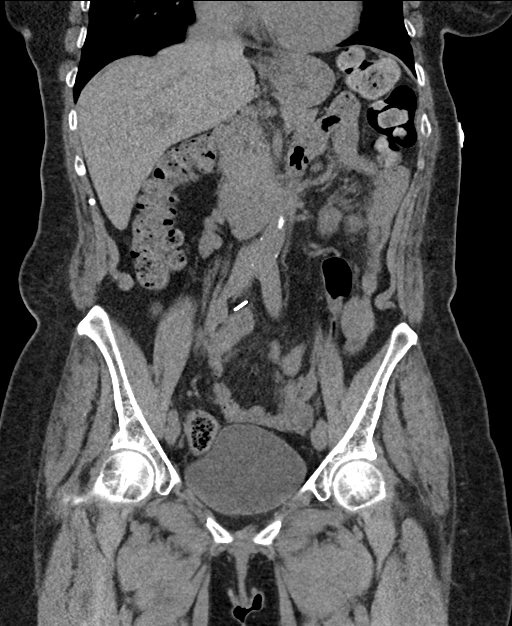
[im 49/89  soft-tissue]
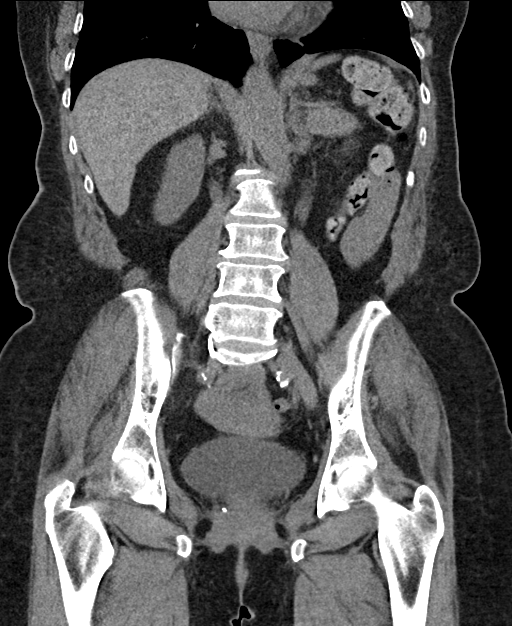

[16 of 46 positions shown; findings below may reference images not displayed]

FINDINGS: Lower chest: Mild scarring in the lung bases. No infiltrate or
effusion. Decreased visibility of small right lower lobe nodule
since the prior study.

Hepatobiliary: Normal liver, gallbladder, and bile ducts.

Pancreas: Negative

Spleen: Negative

Adrenals/Urinary Tract: Negative for urinary tract calculi. No renal
mass or obstruction. Normal bladder.

Stomach/Bowel: Negative for bowel obstruction. Small hiatal hernia.
Negative for bowel mass or edema. Postop appendectomy

Vascular/Lymphatic: Mild atherosclerotic disease. Negative for
aortic aneurysm. No adenopathy.

Reproductive: Normal uterus.  Negative for pelvic mass.

Other: No free fluid. Surgical clips around the rectum. History of
cancer of the colon.

Musculoskeletal: No acute skeletal abnormality. Negative for
fracture or mass.
IMPRESSION: Negative for urinary tract calculi. No acute abnormality identified.

## 2020-01-27 ENCOUNTER — Encounter: Payer: Self-pay | Admitting: *Deleted

## 2020-01-30 ENCOUNTER — Ambulatory Visit: Payer: Medicare Other | Admitting: Internal Medicine

## 2020-10-25 ENCOUNTER — Encounter: Payer: Self-pay | Admitting: Internal Medicine

## 2021-05-17 ENCOUNTER — Telehealth: Payer: Self-pay | Admitting: *Deleted

## 2021-05-17 NOTE — Telephone Encounter (Signed)
When pt called today for PV wanted to reschedule both PV and colon due to schedule conflicts.  Pt was on her way out of town and asked me to call with reschedule info.  Tried to call to give pt new PV and colon date, no answer.  Will continue to try to reach pt. ?

## 2021-05-19 NOTE — Telephone Encounter (Signed)
Gave information about upcoming PV and colon appt. To patient's husband. Pt had signed giving permission for him to receive info.  He stated he would relay info to his wife. ?

## 2021-05-31 ENCOUNTER — Encounter: Payer: Medicare Other | Admitting: Internal Medicine

## 2021-06-21 ENCOUNTER — Ambulatory Visit (AMBULATORY_SURGERY_CENTER): Payer: Medicare Other | Admitting: *Deleted

## 2021-06-21 VITALS — Ht 64.0 in | Wt 153.0 lb

## 2021-06-21 DIAGNOSIS — Z85038 Personal history of other malignant neoplasm of large intestine: Secondary | ICD-10-CM

## 2021-06-21 MED ORDER — PEG 3350-KCL-NA BICARB-NACL 420 G PO SOLR: 4000.0000 mL | Freq: Once | ORAL | 0 refills | Status: AC

## 2021-06-21 NOTE — Progress Notes (Signed)

## 2021-07-05 ENCOUNTER — Encounter: Payer: Self-pay | Admitting: Internal Medicine

## 2021-07-05 ENCOUNTER — Ambulatory Visit (AMBULATORY_SURGERY_CENTER): Payer: Medicare Other | Admitting: Internal Medicine

## 2021-07-05 VITALS — BP 132/72 | HR 67 | Temp 98.4°F | Resp 17 | Ht 64.0 in | Wt 153.0 lb

## 2021-07-05 DIAGNOSIS — Z85038 Personal history of other malignant neoplasm of large intestine: Secondary | ICD-10-CM

## 2021-07-05 DIAGNOSIS — D123 Benign neoplasm of transverse colon: Secondary | ICD-10-CM

## 2021-07-05 DIAGNOSIS — D125 Benign neoplasm of sigmoid colon: Secondary | ICD-10-CM | POA: Diagnosis not present

## 2021-07-05 DIAGNOSIS — K635 Polyp of colon: Secondary | ICD-10-CM | POA: Diagnosis not present

## 2021-07-05 DIAGNOSIS — D124 Benign neoplasm of descending colon: Secondary | ICD-10-CM

## 2021-07-05 MED ORDER — SODIUM CHLORIDE 0.9 % IV SOLN: 500.0000 mL | Freq: Once | INTRAVENOUS | Status: DC

## 2021-07-05 NOTE — Progress Notes (Signed)
Pt's states no medical or surgical changes since previsit or office visit. 

## 2021-07-05 NOTE — Progress Notes (Signed)
? ?GASTROENTEROLOGY PROCEDURE H&P NOTE  ? ?Primary Care Physician: ?Emelda Fear, DO ? ? ? ?Reason for Procedure:  Personal history of rectal cancer, and history of adenomatous colon polyps ? ?Plan:    Surveillance colonoscopy ? ?Patient is appropriate for endoscopic procedure(s) in the ambulatory ([facility]) setting. ? ?The nature of the procedure, as well as the risks, benefits, and alternatives were carefully and thoroughly reviewed with the patient. Ample time for discussion and questions allowed. The patient understood, was satisfied, and agreed to proceed.  ? ? ? ?HPI: ?Alexis Cabrera is a 71 y.o. female who presents for surveillance colonoscopy.  Medical history as below.  Tolerated the prep.  No recent chest pain or shortness of breath.  No abdominal pain today. ? ?Past Medical History:  ?Diagnosis Date  ? Allergy   ? seasonal  ? Arthritis   ? Blood transfusion   ? Blood transfusion without reported diagnosis   ? Cancer [facility])   ? colon/in appendix  ? Elevated liver enzymes   ? GERD (gastroesophageal reflux disease)   ? Hyperlipidemia   ? Hypertension   ? Hypokalemia   ? Immunocompromised ([facility])   ? Insomnia   ? Lung nodule   ? Peripheral neuropathy   ? 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 Rheumatoid arthritis ([place])   ? Tubular adenoma of colon   ? ? ?Past Surgical History:  ?Procedure Laterality Date  ? APPENDECTOMY    ? cancer  ? CESAREAN SECTION    ? twice  ? COLON RESECTION    ? COLON SURGERY    ? cancer/treated with chemo and radiation  ? COLONOSCOPY    ? POLYPECTOMY    ? ? ?Prior to Admission medications   ?Medication Sig Start Date End Date Taking? Authorizing Provider  ?amLODipine (NORVASC) 5 MG tablet Take 5 mg by mouth daily.   Yes [provider]  ?potassium chloride SA (K-DUR,KLOR-CON) 20 MEQ tablet Take 1 tablet by mouth Daily. 06/23/10  Yes [provider]  ?zolpidem (AMBIEN) 10 MG tablet 1 tablet at bedtime as needed   Yes [provider]   ?Adalimumab (HUMIRA) 40 MG/0.8ML PSKT Humira 40 mg/0.8 mL subcutaneous syringe kit ? INJECT 0.8 ML UNDER THE SKIN EVERY 2 WEEKS    [provider]  ?losartan (COZAAR) 50 MG tablet Take 50 mg by mouth daily. ?Patient not taking: Reported on 06/21/2021    [provider]  ?pantoprazole (PROTONIX) 40 MG tablet pantoprazole 40 mg tablet,delayed release ? TAKE 1 TABLET BY MOUTH EVERY DAY    [provider]  ?potassium chloride SA (KLOR-CON M) 20 MEQ tablet 2 tablets with food ?Patient not taking: Reported on 06/21/2021    [provider]  ?sucralfate (CARAFATE) 1 g tablet Take 1 g by mouth 4 (four) times daily as needed. 05/21/21   [provider]  ?triamcinolone cream (KENALOG) 0.1 % triamcinolone acetonide 0.1 % topical cream    [provider]  ? ? ?Current Outpatient Medications  ?Medication Sig Dispense Refill  ? amLODipine (NORVASC) 5 MG tablet Take 5 mg by mouth daily.    ? potassium chloride SA (K-DUR,KLOR-CON) 20 MEQ tablet Take 1 tablet by mouth Daily.    ? zolpidem (AMBIEN) 10 MG tablet 1 tablet at bedtime as needed    ? Adalimumab (HUMIRA) 40 MG/0.8ML PSKT Humira 40 mg/0.8 mL subcutaneous syringe kit ? INJECT 0.8 ML UNDER THE SKIN EVERY 2 WEEKS    ? losartan (COZAAR) 50  MG tablet Take 50 mg by mouth daily. (Patient not taking: Reported on 06/21/2021)    ? pantoprazole (PROTONIX) 40 MG tablet pantoprazole 40 mg tablet,delayed release ? TAKE 1 TABLET BY MOUTH EVERY DAY    ? potassium chloride SA (KLOR-CON M) 20 MEQ tablet 2 tablets with food (Patient not taking: Reported on 06/21/2021)    ? sucralfate (CARAFATE) 1 g tablet Take 1 g by mouth 4 (four) times daily as needed.    ? triamcinolone cream (KENALOG) 0.1 % triamcinolone acetonide 0.1 % topical cream    ? ?Current Facility-Administered Medications  ?Medication Dose Route Frequency Provider Last Rate Last Admin  ? 0.9 %  sodium chloride infusion  500 mL Intravenous Continuous Sable Feil, MD      ?  0.9 %  sodium chloride infusion  500 mL Intravenous Once Matilde Pottenger, Lajuan Lines, MD      ? ? ?Allergies as of 07/05/2021 - Review Complete 07/05/2021  ?Allergen Reaction Noted  ? Hydrocodone Other (See Comments) 07/16/2010  ? ? ?Family History  ?Problem Relation Age of Onset  ? Colon cancer Father 36  ? Prostate cancer Father   ? Colon cancer Sister 52  ? Ovarian cancer Sister   ? Breast cancer Sister   ? Esophageal cancer Neg Hx   ? Stomach cancer Neg Hx   ? Rectal cancer Neg Hx   ? ? ?Social History  ? ?Socioeconomic History  ? Marital status: Married  ?  Spouse name: Not on file  ? Number of children: Not on file  ? Years of education: Not on file  ? Highest education level: Not on file  ?Occupational History  ? Not on file  ?Tobacco Use  ? Smoking status: Never  ? Smokeless tobacco: Never  ?Vaping Use  ? Vaping Use: Never used  ?Substance and Sexual Activity  ? Alcohol use: Never  ?  Alcohol/week: 0.0 standard drinks  ? Drug use: No  ? Sexual activity: Not on file  ?Other Topics Concern  ? Not on file  ?Social History Narrative  ? Not on file  ? ?Social Determinants of Health  ? ?Financial Resource Strain: Not on file  ?Food Insecurity: Not on file  ?Transportation Needs: Not on file  ?Physical Activity: Not on file  ?Stress: Not on file  ?Social Connections: Not on file  ?Intimate Partner Violence: Not on file  ? ? ?Physical Exam: ?Vital signs in last 24 hours: ?@BP  (!) 180/95   Pulse 66   Temp 98.4 ?F (36.9 ?C)   Ht 5' 4"  (1.626 m)   Wt 153 lb (69.4 kg)   SpO2 99%   BMI 26.26 kg/m?  ?GEN: NAD ?EYE: Sclerae anicteric ?ENT: MMM ?CV: Non-tachycardic ?Pulm: CTA b/l ?GI: Soft, NT/ND ?NEURO:  Alert & Oriented x 3 ? ? ?Zenovia Jarred, MD ?[facility] ? ?07/05/2021 11:05 AM ? ?

## 2021-07-05 NOTE — Progress Notes (Signed)
Pt non-responsive, VVS, Report to RN  °

## 2021-07-05 NOTE — Patient Instructions (Signed)
Resume previous medications.  11 polyps removed and sent to pathology.  Await results for final recommendations.   ? ?Handouts on findings given to patient.   (Polyps, hemorrhoids, diverticulosis) ? ?Use Miralax for constipation and Preparation H for hemorrhoids ? ?YOU HAD AN ENDOSCOPIC PROCEDURE TODAY AT [facility]:   Refer to the procedure report that was given to you for any specific questions about what was found during the examination.  If the procedure report does not answer your questions, please call your gastroenterologist to clarify.  If you requested that your care partner not be given the details of your procedure findings, then the procedure report has been included in a sealed envelope for you to review at your convenience later. ? ?YOU SHOULD EXPECT: Some feelings of bloating in the abdomen. Passage of more gas than usual.  Walking can help get rid of the air that was put into your GI tract during the procedure and reduce the bloating. If you had a lower endoscopy (such as a colonoscopy or flexible sigmoidoscopy) you may notice spotting of blood in your stool or on the toilet paper. If you underwent a bowel prep for your procedure, you may not have a normal bowel movement for a few days. ? ?Please Note:  You might notice some irritation and congestion in your nose or some drainage.  This is from the oxygen used during your procedure.  There is no need for concern and it should clear up in a day or so. ? ?SYMPTOMS TO REPORT IMMEDIATELY: ? ?Following lower endoscopy (colonoscopy or flexible sigmoidoscopy): ? Excessive amounts of blood in the stool ? Significant tenderness or worsening of abdominal pains ? Swelling of the abdomen that is new, acute ? Fever of 100?F or higher ? ?For urgent or emergent issues, a gastroenterologist can be reached at any hour by calling [phone]. ?Do not use MyChart messaging for urgent concerns.  ? ? ?DIET:  We do recommend a small meal at first, but  then you may proceed to your regular diet.  Drink plenty of fluids but you should avoid alcoholic beverages for 24 hours. ? ?ACTIVITY:  You should plan to take it easy for the rest of today and you should NOT DRIVE or use heavy machinery until tomorrow (because of the sedation medicines used during the test).   ? ?FOLLOW UP: ?Our staff will call the number listed on your records 48-72 hours following your procedure to check on you and address any questions or concerns that you may have regarding the information given to you following your procedure. If we do not reach you, we will leave a message.  We will attempt to reach you two times.  During this call, we will ask if you have developed any symptoms of COVID 19. If you develop any symptoms (ie: fever, flu-like symptoms, shortness of breath, cough etc.) before then, please call [phone].  If you test positive for Covid 19 in the 2 weeks post procedure, please call and report this information to [place].   ? ?If any biopsies were taken you will be contacted by phone or by letter within the next 1-3 weeks.  Please call us at [phone] if you have not heard about the biopsies in 3 weeks.  ? ? ?SIGNATURES/CONFIDENTIALITY: ?You and/or your care partner have signed paperwork which will be entered into your electronic medical record.  These signatures attest to the fact that that the information above on your After Visit Summary  has been reviewed and is understood.  Full responsibility of the confidentiality of this discharge information lies with you and/or your care-partner.  ?

## 2021-07-05 NOTE — Progress Notes (Signed)
Called to room to assist during endoscopic procedure.  Patient ID and intended procedure confirmed with present staff. Received instructions for my participation in the procedure from the performing physician.  

## 2021-07-05 NOTE — Op Note (Signed)
[facility] ?Patient Name: Alexis Cabrera ?Procedure Date: 07/05/2021 11:06 AM ?MRN: [id number] ?Endoscopist: Jerene Bears , MD ?Age: 71 ?Referring MD:  ?Date of Birth: [birth year] ?Gender: Female ?Account #: [id number] ?Procedure:                Colonoscopy ?Indications:              High risk colon cancer surveillance: Personal  ?                          history of rectal cancer (2005); Last colonoscopy:  ?                          March 2017 (1 adenoma removed) ?Medicines:                Monitored Anesthesia Care ?Procedure:                Pre-Anesthesia Assessment: ?                          - Prior to the procedure, a History and Physical  ?                          was performed, and patient medications and  ?                          allergies were reviewed. The patient's tolerance of  ?                          previous anesthesia was also reviewed. The risks  ?                          and benefits of the procedure and the sedation  ?                          options and risks were discussed with the patient.  ?                          All questions were answered, and informed consent  ?                          was obtained. Prior Anticoagulants: The patient has  ?                          taken no previous anticoagulant or antiplatelet  ?                          agents. ASA Grade Assessment: II - A patient with  ?                          mild systemic disease. After reviewing the risks  ?                          and benefits, the patient was deemed in  ?  satisfactory condition to undergo the procedure. ?                          After obtaining informed consent, the colonoscope  ?                          was passed under direct vision. Throughout the  ?                          procedure, the patient's blood pressure, pulse, and  ?                          oxygen saturations were monitored continuously. The  ?                          Olympus PCF-H190DL (#[id number])  Colonoscope was  ?                          introduced through the anus and advanced to the  ?                          cecum, identified by appendiceal orifice and  ?                          ileocecal valve. The colonoscopy was performed  ?                          without difficulty. The patient tolerated the  ?                          procedure well. The quality of the bowel  ?                          preparation was good. The ileocecal valve,  ?                          appendiceal orifice, and rectum were photographed. ?Scope In: 11:17:28 AM ?Scope Out: 11:42:52 AM ?Scope Withdrawal Time: 0 hours 20 minutes 16 seconds  ?Total Procedure Duration: 0 hours 25 minutes 24 seconds  ?Findings:                 The digital rectal exam was normal. ?                          Nine sessile polyps were found in the transverse  ?                          colon. The polyps were 3 to 6 mm in size. These  ?                          polyps were removed with a cold snare. Resection  ?                          and retrieval were complete. ?  A 2 mm polyp was found in the descending colon. The  ?                          polyp was sessile. The polyp was removed with a  ?                          cold snare. Resection and retrieval were complete. ?                          A 3 mm polyp was found in the sigmoid colon. The  ?                          polyp was sessile. The polyp was removed with a  ?                          cold snare. Resection and retrieval were complete. ?                          A few small-mouthed diverticula were found in the  ?                          ascending colon. ?                          There was evidence of a prior end-to-end  ?                          colo-colonic anastomosis in the rectum. This was  ?                          patent. ?                          Retroflexion in the rectum was not performed due to  ?                          anatomy. ?                           Internal hemorrhoids were found during endoscopy.  ?                          The hemorrhoids were small. ?Complications:            No immediate complications. ?Estimated Blood Loss:     Estimated blood loss was minimal. ?Impression:               - Nine 3 to 6 mm polyps in the transverse colon,  ?                          removed with a cold snare. Resected and retrieved. ?                          - One 2 mm polyp in the descending colon, removed  ?  with a cold snare. Resected and retrieved. ?                          - One 3 mm polyp in the sigmoid colon, removed with  ?                          a cold snare. Resected and retrieved. ?                          - Diverticulosis in the ascending colon. ?                          - Patent end-to-end colo-colonic anastomosis in the  ?                          mid rectum. ?                          - Small internal hemorrhoids. ?Recommendation:           - Patient has a contact number available for  ?                          emergencies. The signs and symptoms of potential  ?                          delayed complications were discussed with the  ?                          patient. Return to normal activities tomorrow.  ?                          Written discharge instructions were provided to the  ?                          patient. ?                          - Resume previous diet. ?                          - Continue present medications. ?                          - Await pathology results. ?                          - Repeat colonoscopy is recommended for  ?                          surveillance. The colonoscopy date will be  ?                          determined after pathology results from today's  ?                          exam become available  for review. ?Jerene Bears, MD ?07/05/2021 11:54:44 AM ?This report has been signed electronically. ?

## 2021-07-07 ENCOUNTER — Telehealth: Payer: Self-pay | Admitting: *Deleted

## 2021-07-07 ENCOUNTER — Telehealth: Payer: Self-pay

## 2021-07-07 NOTE — Telephone Encounter (Signed)
No answer on first attempt follow up call. Left message.  ?

## 2021-07-07 NOTE — Telephone Encounter (Signed)
Left message to call if having any issues or concerns, B.Starkisha Tullis RN. 

## 2021-07-14 ENCOUNTER — Encounter: Payer: Self-pay | Admitting: Internal Medicine

## 2021-10-26 ENCOUNTER — Other Ambulatory Visit: Payer: Self-pay | Admitting: Internal Medicine

## 2021-10-26 DIAGNOSIS — Z85038 Personal history of other malignant neoplasm of large intestine: Secondary | ICD-10-CM
